# Patient Record
Sex: Female | Born: 1975 | Marital: Married | Smoking: Never smoker
Health system: Southern US, Community
[De-identification: ages and names within clinical notes are randomized; demographics above are authoritative.]

## PROBLEM LIST (undated history)

## (undated) DIAGNOSIS — D696 Thrombocytopenia, unspecified: Secondary | ICD-10-CM

## (undated) DIAGNOSIS — E559 Vitamin D deficiency, unspecified: Secondary | ICD-10-CM

## (undated) HISTORY — DX: Vitamin D deficiency, unspecified: E55.9

## (undated) HISTORY — DX: Thrombocytopenia, unspecified: D69.6

## (undated) HISTORY — PX: WISDOM TOOTH EXTRACTION: SHX21

---

## 2012-10-28 ENCOUNTER — Encounter: Payer: Self-pay | Admitting: Family Medicine

## 2012-10-28 ENCOUNTER — Ambulatory Visit (INDEPENDENT_AMBULATORY_CARE_PROVIDER_SITE_OTHER): Payer: 59 | Admitting: Family Medicine

## 2012-10-28 ENCOUNTER — Other Ambulatory Visit: Payer: Self-pay | Admitting: Family Medicine

## 2012-10-28 VITALS — BP 107/76 | HR 68 | Ht 61.0 in | Wt 194.0 lb

## 2012-10-28 DIAGNOSIS — Z124 Encounter for screening for malignant neoplasm of cervix: Secondary | ICD-10-CM

## 2012-10-28 DIAGNOSIS — E559 Vitamin D deficiency, unspecified: Secondary | ICD-10-CM

## 2012-10-28 DIAGNOSIS — Z01419 Encounter for gynecological examination (general) (routine) without abnormal findings: Secondary | ICD-10-CM

## 2012-10-28 DIAGNOSIS — Z1151 Encounter for screening for human papillomavirus (HPV): Secondary | ICD-10-CM

## 2012-10-28 DIAGNOSIS — E669 Obesity, unspecified: Secondary | ICD-10-CM

## 2012-10-28 DIAGNOSIS — D696 Thrombocytopenia, unspecified: Secondary | ICD-10-CM

## 2012-10-28 MED ORDER — PRENATAL AD PO TABS: 1.0000 | ORAL_TABLET | Freq: Every day | ORAL | Status: DC

## 2012-10-28 NOTE — Progress Notes (Signed)
  Subjective:     Kaitlin Bush is a 36 y.o. female and is here for a comprehensive physical exam. The patient reports ready to attempt pregnancy.  Previous pregnancy-term LTCS in [place].  She is AMA-discussed risks.  Advised PNV's daily.  Also, had blood work with [employer], which revealed mildly low platelets, low vit. D 19.6 and mildly elevated LDL.  History   Social History  . Marital Status: Married    Spouse Name: N/A    Number of Children: N/A  . Years of Education: N/A   Occupational History  . Not on file.   Social History Main Topics  . Smoking status: Never Smoker   . Smokeless tobacco: Not on file  . Alcohol Use: No  . Drug Use: No  . Sexually Active: Yes -- Female partner(s)    Birth Control/ Protection: None   Other Topics Concern  . Not on file   Social History Narrative  . No narrative on file   No health maintenance topics applied.  The following portions of the patient's history were reviewed and updated as appropriate: allergies, current medications, past family history, past medical history, past social history, past surgical history and problem list.  Review of Systems A comprehensive review of systems was negative.   Objective:    BP 107/76  Pulse 68  Ht 5\' 1"  (1.549 m)  Wt 194 lb (87.998 kg)  BMI 36.66 kg/m2  LMP 10/07/2012 General appearance: alert, cooperative and appears stated age, obese Head: Normocephalic, without obvious abnormality, atraumatic Neck: no adenopathy, supple, symmetrical, trachea midline and thyroid not enlarged, symmetric, no tenderness/mass/nodules Lungs: clear to auscultation bilaterally Breasts: normal appearance, no masses or tenderness Heart: regular rate and rhythm, S1, S2 normal, no murmur, click, rub or gallop Abdomen: soft, non-tender; bowel sounds normal; no masses,  no organomegaly Pelvic: cervix normal in appearance, external genitalia normal, no adnexal masses or tenderness, no cervical motion tenderness, uterus  normal size, shape, and consistency, vagina normal without discharge and pelvic exam limited by patient discomfort. Extremities: extremities normal, atraumatic, no cyanosis or edema Pulses: 2+ and symmetric Skin: Skin color, texture, turgor normal. No rashes or lesions Lymph nodes: Cervical, supraclavicular, and axillary nodes normal. Neurologic: Grossly normal    Assessment:    Healthy female exam. Low plts-slightly Preparing for pregnancy Slight vit. D deficiency.     Plan:  Discussed preconception AMA, vits, Vit D replacement. Pap smear today with HPV typing.   See After Visit Summary for Counseling Recommendations

## 2012-10-28 NOTE — Patient Instructions (Signed)
Vitamin D Deficiency  Vitamin D is an important vitamin that your body needs. Having too little of it in your body is called a deficiency. A very bad deficiency can make your bones soft and can cause a condition called rickets.   Vitamin D is important to your body for different reasons, such as:    It helps your body absorb 2 minerals called calcium and phosphorus.   It helps make your bones healthy.   It may prevent some diseases, such as diabetes and multiple sclerosis.   It helps your muscles and heart.  You can get vitamin D in several ways. It is a natural part of some foods. The vitamin is also added to some dairy products and cereals. Some people take vitamin D supplements. Also, your body makes vitamin D when you are in the sun. It changes the sun's rays into a form of the vitamin that your body can use.  CAUSES    Not eating enough foods that contain vitamin D.   Not getting enough sunlight.   Having certain digestive system diseases that make it hard to absorb vitamin D. These diseases include Crohn's disease, chronic pancreatitis, and cystic fibrosis.   Having a surgery in which part of the stomach or small intestine is removed.   Being obese. Fat cells pull vitamin D out of your blood. That means that obese people may not have enough vitamin D left in their blood and in other body tissues.   Having chronic kidney or liver disease.  RISK FACTORS  Risk factors are things that make you more likely to develop a vitamin D deficiency. They include:   Being older.   Not being able to get outside very much.   Living in a nursing home.   Having had broken bones.   Having weak or thin bones (osteoporosis).   Having a disease or condition that changes how your body absorbs vitamin D.   Having dark skin.   Some medicines such as seizure medicines or steroids.   Being overweight or obese.  SYMPTOMS   Mild cases of vitamin D deficiency may not have any symptoms. If you have a very bad case, symptoms may include:   Bone pain.   Muscle pain.   Falling often.   Broken bones caused by a minor injury, due to osteoporosis.  DIAGNOSIS  A blood test is the best way to tell if you have a vitamin D deficiency.  TREATMENT  Vitamin D deficiency can be treated in different ways. Treatment for vitamin D deficiency depends on what is causing it. Options include:   Taking vitamin D supplements.   Taking a calcium supplement. Your caregiver will suggest what dose is best for you.  HOME CARE INSTRUCTIONS   Take any supplements that your caregiver prescribes. Follow the directions carefully. Take only the suggested amount.   Have your blood tested 2 months after you start taking supplements.   Eat foods that contain vitamin D. Healthy choices include:   Fortified dairy products, cereals, or juices. Fortified means vitamin D has been added to the food. Check the label on the package to be sure.   Fatty fish like salmon or trout.   Eggs.   Oysters.   Spend some time in the sun. Most people should get out in the sun without sunblock for 10 to 15 minutes at a time. Do this 3 times a week. People who have had skin cancer should not do this. Ask your   vitamin D deficiency is going away. SEEK MEDICAL CARE IF:  You have any questions about your treatment.  You continue to have symptoms of vitamin D deficiency.  You have nausea or vomiting.  You are constipated.  You feel confused.  You have severe abdominal or back pain. MAKE SURE YOU:  Understand these instructions.  Will watch your condition.  Will get help right away if you are not doing well or get worse. Document Released: 03/10/2012 Document Reviewed: 03/10/2012 [facility] Patient Information 2013 [place],  [place]. Preparing for Pregnancy Preparing for pregnancy (preconceptual care) by getting counseling and information from your caregiver before getting pregnant is a good idea. It will help you and your baby have a better chance to have a healthy, safe pregnancy and delivery of your baby. Make an appointment with your caregiver to talk about your health, medical, and family history and how to prepare yourself before getting pregnant. Your caregiver will do a complete physical exam and a Pap test. They will want to know:  About you, your spouse or partner, and your family's medical and genetic history.  If you are eating a balanced diet and drinking enough fluids.  What vitamins and mineral supplements you are taking. This includes taking folic acid before getting pregnant to help prevent birth defects.  What medications you are taking including prescription, over-the-counter and herbal medications.  If there is any substance abuse like alcohol, smoking, and illegal drugs.  If there is any mental or physical domestic violence.  If there is any risk of sexually transmitted disease between you and your partner.  What immunizations and vaccinations you have had and what you may need before getting pregnant.  If you should get tested for HIV infection.  If there is any exposure to chemical or toxic substances at home or work.  If there are medical problems you have that need to be treated and kept under control before getting pregnant such as diabetes, high blood pressure or others.  If there were any past surgeries, pregnancies and problems with them.  What your current weight is and to set a goal as to how much weight you should gain while pregnant. Also, they will check if you should lose or gain weight before getting pregnant.  What is your exercise routine and what it is safe when you are pregnant.  If there are any physical disabilities that need to be addressed.  About spacing your  pregnancies when there are other children.  If there is a financial problem that may affect you having a child. After talking about the above points with your caregiver, your caregiver will give you advice on how to help treat and work with you on solving any issues, if necessary, before getting pregnant. The goal is to have a healthy and safe pregnancy for you and your baby. You should keep an accurate record of your menstrual periods because it will help in determining your due date. Immunizations that you should have before getting pregnant:   Regular measles, [place] measles (rubella) and mumps.  Tetanus and diphtheria.  Chickenpox, if not immune.  Herpes zoster (Varicella) if not immune.  Human papilloma virus vaccine (HPV) between the age of 102 and 52 years old.  Hepatitis A vaccine.  Hepatitis B vaccine.  Influenza vaccine.  Pneumococcal vaccine (pneumonia). You should avoid getting pregnant for one month after getting vaccinated with a live virus vaccine such as [place] measles (rubella) vaccine. Other immunizations may be necessary depending on where  you live, such as malaria. Ask your caregiver if any other immunizations are needed for you. HOME CARE INSTRUCTIONS   Follow the advice of your caregiver.  Before getting pregnant:  Begin taking vitamins, supplements, and 0.4 milligrams folic acid daily.  Get your immunizations up-to-date.  Get help from a nutrition counselor if you do not understand what a balanced diet is, need help with a special medical diet or if you need help to lose or gain weight.  Begin exercising.  Stop smoking, taking illegal drugs, and drinking alcoholic beverages.  Get counseling if there is and type of domestic violence.  Get checked for sexually transmitted diseases including HIV.  Get any medical problems under control (diabetes, high blood pressure, convulsions, asthma or others).  Resolve any financial concerns.  Be sure you and  your spouse or partner are ready to have a baby.  Keep an accurate record of your menstrual periods. Document Released: 11/29/2008 Document Revised: 03/10/2012 Document Reviewed: 11/29/2008 [facility] Patient Information 2013 [place], [place].

## 2012-11-06 LAB — PAP LB AND HPV HIGH-RISK

## 2013-08-28 ENCOUNTER — Ambulatory Visit: Payer: 59 | Admitting: Family Medicine

## 2013-08-28 ENCOUNTER — Ambulatory Visit (INDEPENDENT_AMBULATORY_CARE_PROVIDER_SITE_OTHER): Payer: 59 | Admitting: Family Medicine

## 2013-08-28 ENCOUNTER — Encounter: Payer: Self-pay | Admitting: Family Medicine

## 2013-08-28 VITALS — BP 105/66 | HR 62 | Ht 62.0 in | Wt 199.0 lb

## 2013-08-28 DIAGNOSIS — IMO0002 Reserved for concepts with insufficient information to code with codable children: Secondary | ICD-10-CM | POA: Insufficient documentation

## 2013-08-28 DIAGNOSIS — N979 Female infertility, unspecified: Secondary | ICD-10-CM

## 2013-08-28 NOTE — Progress Notes (Signed)
  Subjective:    Patient ID: Kaitlin Bush, female    DOB: 04/08/1976, 37 y.o.   MRN: [id number]  HPI Last seen in 10/13.  Trying to achieve pregnancy since that time (10-11 months).  Last baby in 2006 ended in C-section. Pt has regular cycles.  No significant pain with periods.     Review of Systems  Constitutional: Negative for fever and chills.  Respiratory: Negative for shortness of breath.   Genitourinary: Negative for dysuria and vaginal discharge.  Skin: Negative for rash.       Objective:   Physical Exam  Vitals reviewed. Constitutional: She appears well-developed and well-nourished.  HENT:  Head: Normocephalic and atraumatic.  Eyes: No scleral icterus.  Neck: Neck supple.  Cardiovascular: Normal rate.   No murmur heard. Pulmonary/Chest: Effort normal.  Abdominal: Soft. There is no tenderness.          Assessment & Plan:

## 2013-08-28 NOTE — Patient Instructions (Signed)
Infertility WHAT IS INFERTILITY?  Infertility is usually defined as not being able to get pregnant after trying for one year of regular sexual intercourse without the use of contraceptives. Or not being able to carry a pregnancy to term and have a baby. The infertility rate in the [place] is around 10%. Pregnancy is the result of a chain of events. A woman must release an egg from one of her ovaries (ovulation). The egg must be fertilized by the female sperm. Then it travels through a fallopian tube into the uterus (womb), where it attaches to the wall of the uterus and grows. A man must have enough sperm, and the sperm must join with (fertilize) the egg along the way, at the proper time. The fertilized egg must then become attached to the inside of the uterus. While this may seem simple, many things can happen to prevent pregnancy from occurring.  WHOSE PROBLEM IS IT?  About 20% of infertility cases are due to problems with the man (female factors) and 65% are due to problems with the woman (female factors). Other cases are due to a combination of female and female factors or to unknown causes.  WHAT CAUSES INFERTILITY IN MEN?  Infertility in men is often caused by problems with making enough normal sperm or getting the sperm to reach the egg. Problems with sperm may exist from birth or develop later in life, due to illness or injury. Some men produce no sperm, or produce too few sperm (oligospermia). Other problems include:  Sexual dysfunction.  Hormonal or endocrine problems.  Age. Female fertility decreases with age, but not at as young an age as female fertility.  Infection.  Congenital problems. Birth defect, such as absence of the tubes that carry the sperm (vas deferens).  Genetic/chromosomal problems.  Antisperm antibody problems.  Retrograde ejaculation (sperm go into the bladder).  Varicoceles, spematoceles, or tumors of the testicles.  Lifestyle can influence the number and  quality of a man's sperm.  Alcohol and drugs can temporarily reduce sperm quality.  Environmental toxins, including pesticides and lead, may cause some cases of infertility in men. WHAT CAUSES INFERTILITY IN WOMEN?   Problems with ovulation account for most infertility in women. Without ovulation, eggs are not available to be fertilized.  Signs of problems with ovulation include irregular menstrual periods or no periods at all.  Simple lifestyle factors, including stress, diet, or athletic training, can affect a woman's hormonal balance.  Age. Fertility begins to decrease in women in the early 30s and is worse after age 37.  Much less often, a hormonal imbalance from a serious medical problem, such as a pituitary gland tumor, thyroid or other chronic medical disease, can cause ovulation problems.  Pelvic infections.  Polycystic ovary syndrome (increase in female hormones, unable to ovulate).  Alcohol or illegal drugs.  Environmental toxins, radiation, pesticides, and certain chemicals.  Aging is an important factor in female infertility.  The ability of a woman's ovaries to produce eggs declines with age, especially after age 35. About one third of couples where the woman is over 35 will have problems with fertility.  By the time she reaches menopause when her monthly periods stop for good, a woman can no longer produce eggs or become pregnant.  Other problems can also lead to infertility in women. If the fallopian tubes are blocked at one or both ends, the egg cannot travel through the tubes into the uterus. Scar tissue (adhesions) in the pelvis may cause blocked   tubes. This may result from pelvic inflammatory disease, endometriosis, or surgery for an ectopic pregnancy (fertilized egg implanted outside the uterus) or any pelvic or abdominal surgery causing adhesions.  Fibroid tumors or polyps of the uterus.  Congenital (birth defect) abnormalities of the uterus.  Infection of the  cervix (cervicitis).  Cervical stenosis (narrowing).  Abnormal cervical mucus.  Polycystic ovary syndrome.  Having sexual intercourse too often (every other day or 4 to 5 times a week).  Obesity.  Anorexia.  Poor nutrition.  Over exercising, with loss of body fat.  DES. Your mother received diethylstilbesterol hormone when pregnant with you. HOW IS INFERTILITY TESTED?  If you have been trying to have a baby without success, you may want to seek medical help. You should not wait for one year of trying before seeing a health care provider if:  You are over 35.  You have reason to believe that there may be a fertility problem. A medical evaluation may determine the reasons for a couple's infertility. Usually this process begins with:  Physical exams.  Medical histories of both partners.  Sexual histories of both partners. If there is no obvious problem, like improperly timed intercourse or absence of ovulation, tests may be needed.   For a man, testing usually begins with tests of his semen to look at:  The number of sperm.  The shape of sperm.  Movement of his sperm.  Taking a complete medical and surgical history.  Physical examination.  Check for infection of the female reproductive organs. Sometimes hormone tests are done.   For a woman, the first step in testing is to find out if she is ovulating each month. There are several ways to do this. For example, she can keep track of changes in her morning body temperature and in the texture of her cervical mucus. Another tool is a home ovulation test kit, which can be bought at drug or grocery stores.  Checks of ovulation can also be done in the doctor's office, using blood tests for hormone levels or ultrasound tests of the ovaries. If the woman is ovulating, more tests will need to be done. Some common female tests include:  Hysterosalpingogram: An x-ray of the fallopian tubes and uterus after they are injected with  dye. It shows if the tubes are open and shows the shape of the uterus.  Laparoscopy: An exam of the tubes and other female organs for disease. A lighted tube called a laparoscope is used to see inside the abdomen.  Endometrial biopsy: Sample of uterus tissue taken on the first day of the menstrual period, to see if the tissue indicates you are ovulating.  Transvaginal ultrasound: Examines the female organs.  Hysteroscopy: Uses a lighted tube to examine the cervix and inside the uterus, to see if there are any abnormalities inside the uterus. TREATMENT  Depending on the test results, different treatments can be suggested. The type of treatment depends on the cause. 85 to 90% of infertility cases are treated with drugs or surgery.   Various fertility drugs may be used for women with ovulation problems. It is important to talk with your caregiver about the drug to be used. You should understand the drug's benefits and side effects. Depending on the type of fertility drug and the dosage of the drug used, multiple births (twins or multiples) can occur in some women.  If needed, surgery can be done to repair damage to a woman's ovaries, fallopian tubes, cervix, or uterus.  Surgery   or medical treatment for endometriosis or polycystic ovary syndrome. Sometimes a man has an infertility problem that can be corrected with medicine or by surgery.  Intrauterine insemination (IUI) of sperm, timed with ovulation.  Change in lifestyle, if that is the cause (lose weight, increase exercise, and stop smoking, drinking excessively, or taking illegal drugs).  Other types of surgery:  Removing growths inside and on the uterus.  Removing scar tissue from inside of the uterus.  Fixing blocked tubes.  Removing scar tissue in the pelvis and around the female organs. WHAT IS ASSISTED REPRODUCTIVE TECHNOLOGY (ART)?  Assisted reproductive technology (ART) is another form of special methods used to help infertile  couples. ART involves handling both the woman's eggs and the man's sperm. Success rates vary and depend on many factors. ART can be expensive and time-consuming. But ART has made it possible for many couples to have children that otherwise would not have been conceived. Some methods are listed below:  In vitro fertilization (IVF). IVF is often used when a woman's fallopian tubes are blocked or when a man has low sperm counts. A drug is used to stimulate the ovaries to produce multiple eggs. Once mature, the eggs are removed and placed in a culture dish with the man's sperm for fertilization. After about 40 hours, the eggs are examined to see if they have become fertilized by the sperm and are dividing into cells. These fertilized eggs (embryos) are then placed in the woman's uterus. This bypasses the fallopian tubes.  Gamete intrafallopian transfer (GIFT) is similar to IVF, but used when the woman has at least one normal fallopian tube. Three to five eggs are placed in the fallopian tube, along with the man's sperm, for fertilization inside the woman's body.  Zygote intrafallopian transfer (ZIFT), also called tubal embryo transfer, combines IVF and GIFT. The eggs retrieved from the woman's ovaries are fertilized in the lab and placed in the fallopian tubes rather than in the uterus.  ART procedures sometimes involve the use of donor eggs (eggs from another woman) or previously frozen embryos. Donor eggs may be used if a woman has impaired ovaries or has a genetic disease that could be passed on to her baby.  When performing ART, you are at higher risk for resulting in multiple pregnancies, twins, triplets or more.  Intracytoplasma sperm injection is a procedure that injects a single sperm into the egg to fertilize it.  Embryo transplant is a procedure that starts after growing an embryo in a special media (chemical solution) developed to keep the embryo alive for 2 to 5 days, and then transplanting it  into the uterus. In cases where a cause cannot be found and pregnancy does not occur, adoption may be a consideration. Document Released: 12/20/2003 Document Revised: 03/10/2012 Document Reviewed: 11/15/2009 [employer] Patient Information 2014 [employer].  

## 2013-08-28 NOTE — Assessment & Plan Note (Signed)
Needs [facility]--pt. And husband would like to see how much this costs prior to scheduling.  Will try to obtain semen analysis and day 3 estradiol and FSH.  Will check TSH at that time also.  Given age, will likely need referral to [facility].  They enquired about clomid--advised not usually necessary with regular cycles--but could do trial if money is an issue.

## 2014-01-14 ENCOUNTER — Ambulatory Visit: Payer: Self-pay | Admitting: Internal Medicine

## 2014-01-31 ENCOUNTER — Ambulatory Visit: Payer: Self-pay | Admitting: Internal Medicine

## 2014-11-01 ENCOUNTER — Encounter: Payer: Self-pay | Admitting: Family Medicine

## 2015-07-22 ENCOUNTER — Other Ambulatory Visit: Payer: Self-pay | Admitting: Obstetrics and Gynecology

## 2015-07-22 DIAGNOSIS — N979 Female infertility, unspecified: Secondary | ICD-10-CM

## 2015-07-26 ENCOUNTER — Encounter: Payer: Self-pay | Admitting: Family Medicine

## 2015-07-26 ENCOUNTER — Ambulatory Visit (INDEPENDENT_AMBULATORY_CARE_PROVIDER_SITE_OTHER): Payer: 59 | Admitting: Family Medicine

## 2015-07-26 ENCOUNTER — Encounter (INDEPENDENT_AMBULATORY_CARE_PROVIDER_SITE_OTHER): Payer: Self-pay

## 2015-07-26 VITALS — BP 110/77 | HR 92 | Temp 99.5°F | Resp 17 | Ht 63.0 in | Wt 215.2 lb

## 2015-07-26 DIAGNOSIS — D696 Thrombocytopenia, unspecified: Secondary | ICD-10-CM

## 2015-07-26 DIAGNOSIS — E559 Vitamin D deficiency, unspecified: Secondary | ICD-10-CM

## 2015-07-26 NOTE — Progress Notes (Signed)
Name: Kaitlin Bush   MRN: [id number]    DOB: 24-Nov-1976   Date:07/26/2015       Progress Note  Subjective  Chief Complaint  Chief Complaint  Patient presents with  . Establish Care    NP (Dr. Miles Costain)    HPI Thrombocytopenia Low platelet count at 131. According to the patient, she has been seen by Hematology. No episodes of bleeding. SHe does not wish to recheck her platelet count at this visit. Vitamin D def Pt. Has history of Vitamin D deficiency and is on OTC Multivitamins. She does not wish to recheck her Vitamin D level at this visit. Past Medical History  Diagnosis Date  . Vitamin D deficiency   . Thrombocytopenia     Past Surgical History  Procedure Laterality Date  . Cesarean section  07/02/2005    Family History  Problem Relation Age of Onset  . Hypertension Father   . Cancer Maternal Grandfather     stomach    History   Social History  . Marital Status: Married    Spouse Name: N/A  . Number of Children: N/A  . Years of Education: N/A   Occupational History  . Not on file.   Social History Main Topics  . Smoking status: Never Smoker   . Smokeless tobacco: Never Used  . Alcohol Use: No  . Drug Use: No  . Sexual Activity:    Partners: Male    Birth Control/ Protection: None   Other Topics Concern  . Not on file   Social History Narrative  . No narrative on file     Current outpatient prescriptions:  .  nitrofurantoin, macrocrystal-monohydrate, (MACROBID) 100 MG capsule, , Disp: , Rfl:  .  Prenat w/o A Vit-FeFum-FePo-FA (PROVIDA OB) 20-20-1.25 MG CAPS, , Disp: , Rfl:   No Known Allergies   Review of Systems  Constitutional: Negative for fever, chills, weight loss and malaise/fatigue.  Gastrointestinal: Negative for blood in stool.  Genitourinary: Negative for hematuria.  Musculoskeletal: Negative for back pain and joint pain.  Endo/Heme/Allergies: Does not bruise/bleed easily.      Objective  Filed Vitals:   07/26/15 1454  BP:  110/77  Pulse: 92  Temp: 99.5 F (37.5 C)  TempSrc: Oral  Resp: 17  Height: 5\' 3"  (1.6 m)  Weight: 215 lb 3.2 oz (97.614 kg)  SpO2: 97%    Physical Exam  Constitutional: She is oriented to person, place, and time and well-developed, well-nourished, and in no distress.  HENT:  Head: Normocephalic and atraumatic.  Cardiovascular: Normal rate and regular rhythm.   Pulmonary/Chest: Effort normal and breath sounds normal.  Neurological: She is alert and oriented to person, place, and time.  Nursing note and vitals reviewed.    Assessment & Plan 1. Vitamin D deficiency Recheck vitamin D levels at her follow-up appointment in 1 week.  2. Thrombocytopenia  Recheck CBC at her follow-up appointment in 1 week.  Kaitlin Bush [facility] 07/26/2015 3:35 PM

## 2015-07-29 ENCOUNTER — Ambulatory Visit
Admission: RE | Admit: 2015-07-29 | Discharge: 2015-07-29 | Disposition: A | Discharge status: Home / Self Care | Payer: 59 | Source: Ambulatory Visit | Attending: Obstetrics and Gynecology | Admitting: Obstetrics and Gynecology

## 2015-07-29 DIAGNOSIS — D250 Submucous leiomyoma of uterus: Secondary | ICD-10-CM | POA: Diagnosis not present

## 2015-07-29 DIAGNOSIS — N979 Female infertility, unspecified: Secondary | ICD-10-CM | POA: Insufficient documentation

## 2015-07-29 NOTE — Procedures (Signed)
Sterile speculum placed, cervix cleaned with betadine, HSG catheter placed, and speculum removed.  Some difficulty initially in distending cavity with isovue 300.  Fallopian tubes did not distend.  Clear filling defect noted within cavity smooth well circumscribed and consistent with submucosal leiomyoma.

## 2015-08-01 ENCOUNTER — Encounter: Payer: Self-pay | Admitting: Family Medicine

## 2015-08-04 ENCOUNTER — Encounter: Payer: 59 | Admitting: Family Medicine

## 2015-08-09 ENCOUNTER — Encounter: Payer: Self-pay | Admitting: Family Medicine

## 2015-08-09 ENCOUNTER — Ambulatory Visit (INDEPENDENT_AMBULATORY_CARE_PROVIDER_SITE_OTHER): Payer: 59 | Admitting: Family Medicine

## 2015-08-09 VITALS — BP 112/70 | HR 74 | Temp 98.9°F | Resp 18 | Ht 63.0 in | Wt 218.1 lb

## 2015-08-09 DIAGNOSIS — R8271 Bacteriuria: Secondary | ICD-10-CM | POA: Insufficient documentation

## 2015-08-09 DIAGNOSIS — Z0000 Encounter for general adult medical examination without abnormal findings: Secondary | ICD-10-CM | POA: Diagnosis not present

## 2015-08-09 DIAGNOSIS — N390 Urinary tract infection, site not specified: Secondary | ICD-10-CM | POA: Diagnosis not present

## 2015-08-09 NOTE — Progress Notes (Signed)
Name: Kaitlin Bush   MRN: [id number]    DOB: [birth year]   Date:08/09/2015       Progress Note  Subjective  Chief Complaint  Chief Complaint  Patient presents with  . Annual Exam    CPE    HPI Pt. Is here for an annual physical exam. She reports no problems at today's visit. She follows up with gynecology for pelvic exams and Pap smear.  Past Medical History  Diagnosis Date  . Vitamin D deficiency   . Thrombocytopenia     Past Surgical History  Procedure Laterality Date  . Cesarean section    . Wisdom tooth extraction      x1  . Cesarean section  07/02/2005    Family History  Problem Relation Age of Onset  . Diabetes Mother   . Diabetes Sister     gastational  . Hypertension Maternal Grandmother   . Heart disease Maternal Grandmother   . Heart disease Paternal Grandmother   . Hypertension Paternal Grandmother   . Hypertension Father   . Cancer Maternal Grandfather     stomach    History   Social History  . Marital Status: Married    Spouse Name: N/A  . Number of Children: N/A  . Years of Education: N/A   Occupational History  . Not on file.   Social History Main Topics  . Smoking status: Never Smoker   . Smokeless tobacco: Never Used  . Alcohol Use: No  . Drug Use: No  . Sexual Activity:    Partners: Male    Birth Control/ Protection: None   Other Topics Concern  . Not on file   Social History Narrative   ** Merged History Encounter **         Current outpatient prescriptions:  .  Prenat w/o A Vit-FeFum-FePo-FA (PROVIDA OB) 20-20-1.25 MG CAPS, , Disp: , Rfl:  .  nitrofurantoin, macrocrystal-monohydrate, (MACROBID) 100 MG capsule, , Disp: , Rfl:   No Known Allergies   Review of Systems  Constitutional: Negative for fever, chills, weight loss and malaise/fatigue.  HENT: Negative for congestion, ear pain and sore throat.   Eyes: Negative for blurred vision.  Respiratory: Negative for cough, sputum production and shortness of breath.    Cardiovascular: Negative for chest pain and palpitations.  Gastrointestinal: Negative for heartburn, nausea, vomiting and blood in stool.  Genitourinary: Negative for dysuria and frequency.       Pt. Had a urinalysis 2 weeks ago at an Urgent Care, which showed bacteria in the urine and some blood according to the patient. She wants to have her urine rechecked today.  Musculoskeletal: Negative for myalgias, back pain, joint pain and neck pain.  Skin: Negative for rash.  Neurological: Negative for dizziness, seizures and headaches.  Endo/Heme/Allergies: Does not bruise/bleed easily.  Psychiatric/Behavioral: Negative for depression. The patient is not nervous/anxious.       Objective  Filed Vitals:   08/09/15 0929  BP: 112/70  Pulse: 74  Temp: 98.9 F (37.2 C)  TempSrc: Oral  Resp: 18  Height: 5\' 3"  (1.6 m)  Weight: 218 lb 1.6 oz (98.93 kg)  SpO2: 98%    Physical Exam  Constitutional: She is oriented to person, place, and time and well-developed, well-nourished, and in no distress.  HENT:  Head: Normocephalic and atraumatic.  Right Ear: External ear normal.  Left Ear: External ear normal.  Eyes: Pupils are equal, round, and reactive to light.  Neck: Normal range of motion. Neck supple.  Cardiovascular: Normal rate and regular rhythm.   Pulmonary/Chest: Effort normal and breath sounds normal.  Abdominal: Soft. Bowel sounds are normal.  Genitourinary:  Deferred, pt. has a Editor, commissioning.  Musculoskeletal: Normal range of motion.  Neurological: She is alert and oriented to person, place, and time.  Skin: Skin is warm and dry.  Psychiatric: Memory, affect and judgment normal.  Nursing note and vitals reviewed.    Assessment & Plan  1. Encounter for annual physical exam  - CBC with Differential - Basic Metabolic Panel (BMET) - Hepatic function panel - Vitamin D (25 hydroxy) - TSH - HgB A1c  2. Bacteria in urine Obtain urinalysis and culture for evaluation. -  Urinalysis, Routine w reflex microscopic; Future - Urine Culture   Anndrea Mihelich [facility] 08/09/2015 10:02 AM

## 2015-08-12 LAB — URINALYSIS, ROUTINE W REFLEX MICROSCOPIC
Bilirubin, UA: NEGATIVE
Glucose, UA: NEGATIVE
KETONES UA: NEGATIVE
Leukocytes, UA: NEGATIVE
NITRITE UA: NEGATIVE
PH UA: 7.5 (ref 5.0–7.5)
Protein, UA: NEGATIVE
RBC UA: NEGATIVE
SPEC GRAV UA: 1.014 (ref 1.005–1.030)
UUROB: 0.2 mg/dL (ref 0.2–1.0)

## 2015-08-13 LAB — HEPATIC FUNCTION PANEL
ALK PHOS: 73 IU/L (ref 39–117)
ALT: 25 IU/L (ref 0–32)
AST: 25 IU/L (ref 0–40)
Albumin: 4.2 g/dL (ref 3.5–5.5)
BILIRUBIN TOTAL: 0.3 mg/dL (ref 0.0–1.2)
Bilirubin, Direct: 0.12 mg/dL (ref 0.00–0.40)
TOTAL PROTEIN: 7 g/dL (ref 6.0–8.5)

## 2015-08-13 LAB — BASIC METABOLIC PANEL
BUN / CREAT RATIO: 17 (ref 8–20)
BUN: 12 mg/dL (ref 6–20)
CALCIUM: 9.2 mg/dL (ref 8.7–10.2)
CO2: 25 mmol/L (ref 18–29)
Chloride: 101 mmol/L (ref 97–108)
Creatinine, Ser: 0.69 mg/dL (ref 0.57–1.00)
GFR calc non Af Amer: 112 mL/min/{1.73_m2} (ref >59)
GFR, EST AFRICAN AMERICAN: 129 mL/min/{1.73_m2} (ref >59)
GLUCOSE: 88 mg/dL (ref 65–99)
POTASSIUM: 4.3 mmol/L (ref 3.5–5.2)
SODIUM: 138 mmol/L (ref 134–144)

## 2015-08-13 LAB — CBC WITH DIFFERENTIAL/PLATELET
BASOS ABS: 0 10*3/uL (ref 0.0–0.2)
Basos: 0 %
EOS (ABSOLUTE): 0.1 10*3/uL (ref 0.0–0.4)
EOS: 2 %
HEMOGLOBIN: 13.1 g/dL (ref 11.1–15.9)
Hematocrit: 38.1 % (ref 34.0–46.6)
IMMATURE GRANS (ABS): 0 10*3/uL (ref 0.0–0.1)
IMMATURE GRANULOCYTES: 0 %
LYMPHS ABS: 2.2 10*3/uL (ref 0.7–3.1)
LYMPHS: 35 %
MCH: 31.6 pg (ref 26.6–33.0)
MCHC: 34.4 g/dL (ref 31.5–35.7)
MCV: 92 fL (ref 79–97)
MONOCYTES: 6 %
Monocytes Absolute: 0.4 10*3/uL (ref 0.1–0.9)
NEUTROS ABS: 3.7 10*3/uL (ref 1.4–7.0)
NEUTROS PCT: 57 %
RBC: 4.15 x10E6/uL (ref 3.77–5.28)
RDW: 13.4 % (ref 12.3–15.4)
WBC: 6.4 10*3/uL (ref 3.4–10.8)

## 2015-08-13 LAB — HEMOGLOBIN A1C
Est. average glucose Bld gHb Est-mCnc: 108 mg/dL
HEMOGLOBIN A1C: 5.4 % (ref 4.8–5.6)

## 2015-08-13 LAB — VITAMIN D 25 HYDROXY (VIT D DEFICIENCY, FRACTURES): Vit D, 25-Hydroxy: 19.1 ng/mL — ABNORMAL LOW (ref 30.0–100.0)

## 2015-08-13 LAB — TSH: TSH: 2.69 u[IU]/mL (ref 0.450–4.500)

## 2015-08-14 LAB — URINE CULTURE

## 2015-08-17 ENCOUNTER — Other Ambulatory Visit: Payer: Self-pay | Admitting: Family Medicine

## 2015-08-17 MED ORDER — SULFAMETHOXAZOLE-TRIMETHOPRIM 800-160 MG PO TABS: 1.0000 | ORAL_TABLET | Freq: Two times a day (BID) | ORAL | Status: DC

## 2015-08-17 MED ORDER — VITAMIN D (ERGOCALCIFEROL) 1.25 MG (50000 UNIT) PO CAPS: 50000.0000 [IU] | ORAL_CAPSULE | ORAL | Status: AC

## 2015-08-17 NOTE — Telephone Encounter (Signed)
Per Dr. Manuella Ghazi medication has been ordered and sent to [facility]

## 2015-08-19 ENCOUNTER — Telehealth: Payer: Self-pay | Admitting: Family Medicine

## 2015-08-19 NOTE — Telephone Encounter (Signed)
PT IS ASKING FOR CALL ABOUT LAB RESULTS AND ALSO NEEDS THIS MAILED TO THEM.

## 2015-08-22 NOTE — Telephone Encounter (Signed)
Results have already been reported to patient, and she can view visit notes and results on MyChart, patient verbalized understanding

## 2015-10-18 ENCOUNTER — Ambulatory Visit (INDEPENDENT_AMBULATORY_CARE_PROVIDER_SITE_OTHER): Payer: 59

## 2015-10-18 DIAGNOSIS — Z23 Encounter for immunization: Secondary | ICD-10-CM

## 2015-10-21 ENCOUNTER — Encounter: Payer: Self-pay | Admitting: Family Medicine

## 2015-10-21 ENCOUNTER — Ambulatory Visit (INDEPENDENT_AMBULATORY_CARE_PROVIDER_SITE_OTHER): Payer: 59 | Admitting: Family Medicine

## 2015-10-21 VITALS — BP 112/68 | HR 71 | Temp 98.6°F | Resp 18 | Ht 63.0 in | Wt 223.1 lb

## 2015-10-21 DIAGNOSIS — E669 Obesity, unspecified: Secondary | ICD-10-CM | POA: Diagnosis not present

## 2015-10-21 NOTE — Progress Notes (Signed)
Name: Kaitlin Bush   MRN: [id number]    DOB: 1976-03-31   Date:10/21/2015       Progress Note  Subjective  Chief Complaint  Chief Complaint  Patient presents with  . Advice Only    Paperwork    HPI Pt. Is here for [employer]'s BMI appeal form. Current BMI is 39.52. Her weight is 223 lbs. Pt. Reports she cooks mainly at home and does not eat fast food. She is busy with full time school and in her busy schedule, she does not find time to exercise and thinks that maybe contributing to her weight gain. No other concerns reported.  Past Medical History  Diagnosis Date  . Vitamin D deficiency   . Thrombocytopenia [name])     Past Surgical History  Procedure Laterality Date  . Cesarean section    . Wisdom tooth extraction      x1  . Cesarean section  07/02/2005    Family History  Problem Relation Age of Onset  . Diabetes Mother   . Diabetes Sister     gastational  . Hypertension Maternal Grandmother   . Heart disease Maternal Grandmother   . Heart disease Paternal Grandmother   . Hypertension Paternal Grandmother   . Hypertension Father   . Cancer Maternal Grandfather     stomach    Social History   Social History  . Marital Status: Married    Spouse Name: N/A  . Number of Children: N/A  . Years of Education: N/A   Occupational History  . Not on file.   Social History Main Topics  . Smoking status: Never Smoker   . Smokeless tobacco: Never Used  . Alcohol Use: No  . Drug Use: No  . Sexual Activity:    Partners: Male    Birth Control/ Protection: None   Other Topics Concern  . Not on file   Social History Narrative   ** Merged History Encounter **         Current outpatient prescriptions:  Marland Kitchen  Vitamin D, Ergocalciferol, (DRISDOL) 50000 UNITS CAPS capsule, Take 1 capsule (50,000 Units total) by mouth once a week. For 12 Weeks, Disp: 12 capsule, Rfl: 0  No Known Allergies   ROS   Objective  Filed Vitals:   10/21/15 0932  BP: 112/68  Pulse: 71   Temp: 98.6 F (37 C)  TempSrc: Oral  Resp: 18  Height: 5\' 3"  (1.6 m)  Weight: 223 lb 1.6 oz (101.197 kg)  SpO2: 98%    Physical Exam  Constitutional: She is oriented to person, place, and time and well-developed, well-nourished, and in no distress.  Cardiovascular: Normal rate and regular rhythm.   No murmur heard. Pulmonary/Chest: Effort normal and breath sounds normal. No respiratory distress. She has no wheezes.  Neurological: She is alert and oriented to person, place, and time.  Nursing note and vitals reviewed.  Assessment & Plan  1. Obesity Recommended a program of graduated physical activity including walking for 20-30 minutes every other day and gradually increase the frequency. Patient reports adhering to a healthy diet with mostly home-cooked meals. Recheck weight in 3 months.   Alphonse Asbridge [facility] 10/21/2015 9:52 AM

## 2015-12-03 IMAGING — RF DG HYSTEROGRAM
2 series · 5 of 5 positions shown · IV contrast (omnipaque)
Comparison: None.

CLINICAL DATA: Infertility

EXAM:
HYSTEROSALPINGOGRAM
TECHNIQUE: Following cleansing of the cervix and vagina with Betadine solution,
a hysterosalpingogram was performed using a 5-French
hysterosalpingogram catheter and Omnipaque 300 contrast. The patient
tolerated the examination without difficulty.

[Series 1: cp_pediatric · 0.26mm/px · 1 of 1 slices shown (1 of 2)]
[im 1/1]
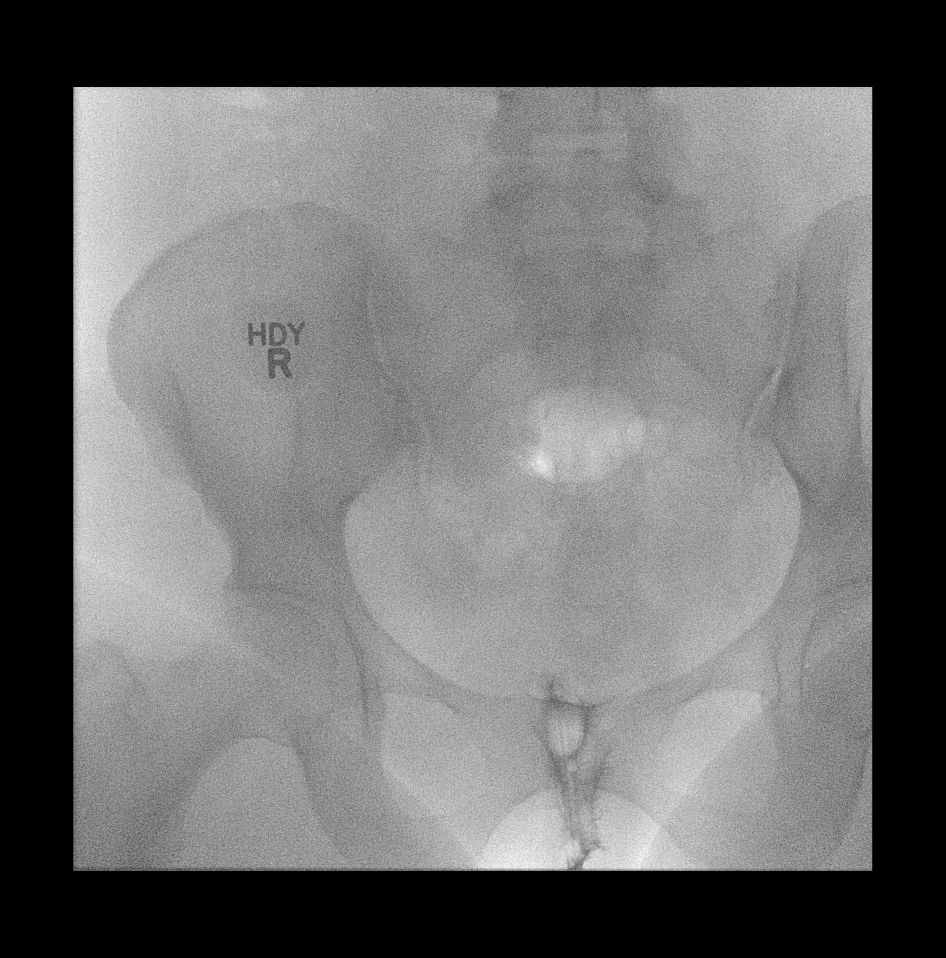

[Series 2: cp_pediatric · 0.54mm/px · 4 of 200 frames shown (2 of 2)]
[frame 31/200]
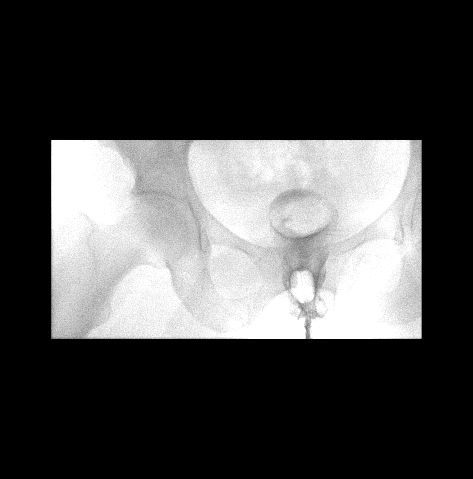
[frame 101/200]
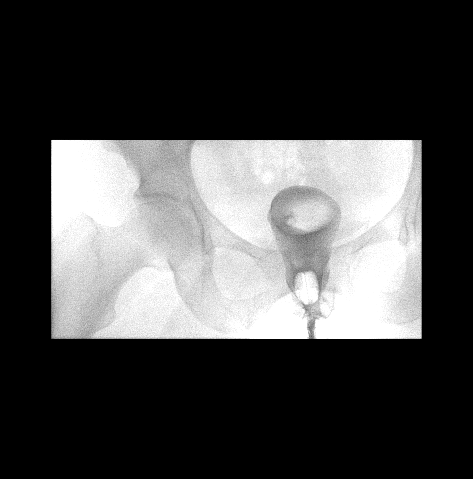
[frame 171/200]
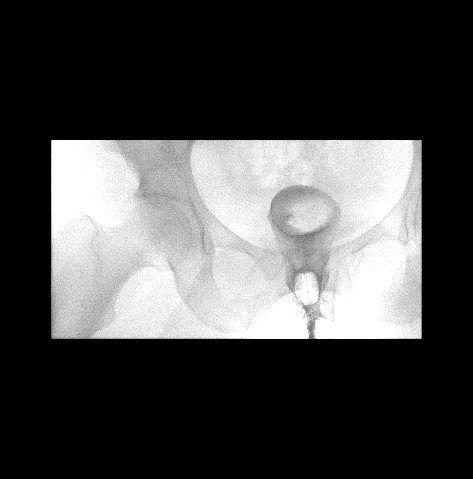
[frame 200/200]
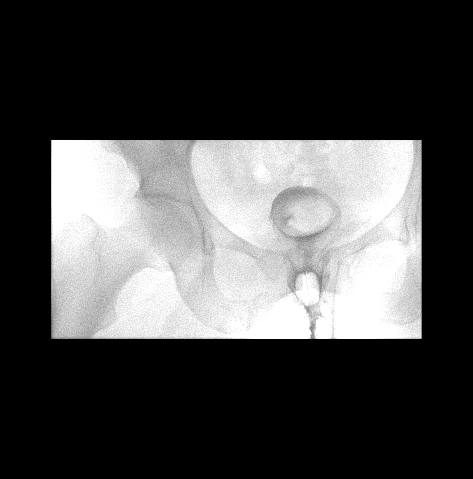

[5 of 5 positions shown; findings below may reference images not displayed]

FLUOROSCOPY TIME:  Radiation Exposure Index (as provided by the
fluoroscopic device): 10.2 mGy
FINDINGS: DR. LABELLE SALHA inserted a catheter into the cervix. 50 ml Isovue 370
contrast was then instilled into the uterus using intermittent
fluoroscopy.

The uterus demonstrates normal contour, orientation, and morphology.
There is a large filling defect within the fundus of the uterus. The
fallopian tubes fail to the fill bilaterally. There was no free
spillage of contrast from the fallopian tubes into the
intraperitoneal cavity.
IMPRESSION: 1. Nonvisualization of bilateral fallopian tubes.
2. Large filling defect in the fundus of the uterus concerning for a
submucosal fibroid. Further evaluation with pelvic ultrasound is
recommended.

## 2016-02-01 ENCOUNTER — Encounter: Payer: Self-pay | Admitting: Family Medicine

## 2016-02-01 ENCOUNTER — Ambulatory Visit: Payer: 59 | Admitting: Family Medicine

## 2016-02-01 NOTE — Progress Notes (Signed)
This encounter was created in error - please disregard.

## 2017-12-11 ENCOUNTER — Ambulatory Visit: Payer: Self-pay | Admitting: Urology

## 2023-05-14 ENCOUNTER — Other Ambulatory Visit: Payer: Self-pay | Admitting: Obstetrics and Gynecology

## 2023-05-14 DIAGNOSIS — Z1231 Encounter for screening mammogram for malignant neoplasm of breast: Secondary | ICD-10-CM

## 2023-08-04 ENCOUNTER — Encounter: Payer: Self-pay | Admitting: *Deleted

## 2023-08-04 ENCOUNTER — Ambulatory Visit
Admission: EM | Admit: 2023-08-04 | Discharge: 2023-08-04 | Disposition: A | Discharge status: Home / Self Care | Payer: Managed Care, Other (non HMO) | Attending: Internal Medicine | Admitting: Internal Medicine

## 2023-08-04 ENCOUNTER — Other Ambulatory Visit: Payer: Self-pay

## 2023-08-04 DIAGNOSIS — H8110 Benign paroxysmal vertigo, unspecified ear: Secondary | ICD-10-CM

## 2023-08-04 DIAGNOSIS — R42 Dizziness and giddiness: Secondary | ICD-10-CM | POA: Diagnosis not present

## 2023-08-04 MED ORDER — MECLIZINE HCL 25 MG PO TABS: 25.0000 mg | ORAL_TABLET | Freq: Three times a day (TID) | ORAL | 0 refills | Status: AC | PRN

## 2023-08-04 NOTE — ED Provider Notes (Signed)
Kaitlin Bush    CSN: [id number] Arrival date & time: 08/04/23  1343      History   Chief Complaint Chief Complaint  Patient presents with   Headache   Nausea   Dizziness    HPI Kaitlin Bush is a 47 y.o. female who presents due to developing HA, nausea and dizziness x 2 days. She describes the dizziness as vertigo. Vomited water x 1. Had low grade subjective fever last night. Denies hx of vertigo.  Denies URI in the past month.  She took Tylenol helped her HA, but the dizziness has continued.    Past Medical History:  Diagnosis Date   Thrombocytopenia (HCC)    Vitamin D deficiency     Patient Active Problem List   Diagnosis Date Noted   Encounter for annual physical exam 08/09/2015   Bacteria in urine 08/09/2015   Vitamin D deficiency 07/26/2015   Thrombocytopenia (HCC) 07/26/2015   Infertility 08/28/2013   Obesity 10/28/2012   Temporary low platelet count (HCC) 10/28/2012   Vitamin D deficiency 10/28/2012    Past Surgical History:  Procedure Laterality Date   CESAREAN SECTION     CESAREAN SECTION  07/02/2005   WISDOM TOOTH EXTRACTION     x1    OB History     Gravida  1   Para  1   Term  1   Preterm  0   AB  0   Living  1      SAB  0   IAB  0   Ectopic  0   Multiple      Live Births  1            Home Medications    Prior to Admission medications   Medication Sig Start Date End Date Taking? Authorizing Provider  meclizine (ANTIVERT) 25 MG tablet Take 1 tablet (25 mg total) by mouth 3 (three) times daily as needed for dizziness. 08/04/23  Yes Rodriguez-Southworth, Nettie Elm, PA-C  Vitamin D, Ergocalciferol, (DRISDOL) 50000 UNITS CAPS capsule Take 1 capsule (50,000 Units total) by mouth once a week. For 12 Weeks 08/17/15  Yes Ellyn Hack, MD    Family History Family History  Problem Relation Age of Onset   Diabetes Mother    Diabetes Sister        gastational   Hypertension Maternal Grandmother    Heart disease  Maternal Grandmother    Heart disease Paternal Grandmother    Hypertension Paternal Grandmother    Hypertension Father    Cancer Maternal Grandfather        stomach    Social History Social History   Tobacco Use   Smoking status: Never   Smokeless tobacco: Never  Substance Use Topics   Alcohol use: No    Alcohol/week: 0.0 standard drinks of alcohol   Drug use: No     Allergies   Patient has no known allergies.   Review of Systems Review of Systems   Physical Exam Triage Vital Signs ED Triage Vitals [08/04/23 1449]  Encounter Vitals Group     BP 119/86     Systolic BP Percentile      Diastolic BP Percentile      Pulse Rate 73     Resp 18     Temp 98.4 F (36.9 C)     Temp src      SpO2 97 %     Weight      Height  Head Circumference      Peak Flow      Pain Score      Pain Loc      Pain Education      Exclude from Growth Chart    No data found.  Updated Vital Signs BP 119/86   Pulse 73   Temp 98.4 F (36.9 C)   Resp 18   LMP 07/29/2023   SpO2 97%   Visual Acuity Right Eye Distance:   Left Eye Distance:   Bilateral Distance:    Right Eye Near:   Left Eye Near:    Bilateral Near:     Physical Exam Physical Exam Vitals signs and nursing note reviewed.  Constitutional:      General: He is not in acute distress.    Appearance: He is well-developed and normal weight. He is not ill-appearing, toxic-appearing or diaphoretic.  HENT:     Head: Normocephalic. L TM dull and gray. R TM clear Eyes:     Extraocular Movements: Extraocular movements intact.     Pupils: Pupils are equal, round, and reactive to light.  Neck:     Musculoskeletal: Neck supple. No neck rigidity.     Meningeal: Brudzinski's sign absent.  Cardiovascular:     Rate and Rhythm: Normal rate and regular rhythm.     Heart sounds: No murmur.  Pulmonary:     Effort: Pulmonary effort is normal.     Breath sounds: Normal breath sounds. No wheezing, rhonchi or rales.   Abdominal:     General: Bowel sounds are normal.     Palpations: Abdomen is soft. There is no mass.     Tenderness: There is no abdominal tenderness. There is no guarding.  Musculoskeletal: Normal range of motion.  Lymphadenopathy:     Cervical: No cervical adenopathy.  Skin:    General: Skin is warm and dry.  Neurological:     Mental Status: SHe is alert. She she laid down for abdominal exam, after a few seconds started feeling the room spinning    Cranial Nerves: No cranial nerve deficit or facial asymmetry.     Sensory: No sensory deficit.     Motor: No weakness.     Coordination: Romberg sign negative. Coordination normal.     Gait: Gait normal.     Deep Tendon Reflexes: Reflexes normal.     Comments: Normal Romberg, finger to nose, tandem gait.   Psychiatric:        Mood and Affect: Mood normal.        Speech: Speech normal.        Behavior: Behavior normal.    UC Treatments / Results  Labs (all labs ordered are listed, but only abnormal results are displayed) Labs Reviewed - No data to display  EKG   Radiology No results found.  Procedures Procedures (including critical care time)  Medications Ordered in UC Medications - No data to display  Initial Impression / Assessment and Plan / UC Course  I have reviewed the triage vital signs and the nursing notes.  Vertigo  She was placed on Meclizine as noted.   Final Clinical Impressions(s) / UC Diagnoses   Final diagnoses:  Benign paroxysmal positional vertigo, unspecified laterality   Discharge Instructions   None    ED Prescriptions     Medication Sig Dispense Auth. Provider   meclizine (ANTIVERT) 25 MG tablet Take 1 tablet (25 mg total) by mouth 3 (three) times daily as needed for dizziness. 21 tablet Rodriguez-Southworth,  Nettie Elm, PA-C      PDMP not reviewed this encounter.   Garey Ham, [place] 08/04/23 1702

## 2023-08-04 NOTE — ED Triage Notes (Signed)
Pt reports Ha,nausea and dizziness started on Friday. Pt gets dizzy when going from sitting to standing. Pt says her head feels much better when her head is on a pillow
